# Patient Record
Sex: Female | Born: 1980 | Race: Black or African American | Hispanic: No | Marital: Single | Smoking: Former smoker
Health system: Southern US, Community
[De-identification: ages and names within clinical notes are randomized; demographics above are authoritative.]

## PROBLEM LIST (undated history)

## (undated) DIAGNOSIS — I10 Essential (primary) hypertension: Secondary | ICD-10-CM

## (undated) DIAGNOSIS — G43909 Migraine, unspecified, not intractable, without status migrainosus: Secondary | ICD-10-CM

## (undated) HISTORY — PX: TUBAL LIGATION: SHX77

## (undated) HISTORY — PX: ABDOMINAL HYSTERECTOMY: SHX81

---

## 2003-08-29 ENCOUNTER — Emergency Department (HOSPITAL_COMMUNITY): Admission: EM | Admit: 2003-08-29 | Discharge: 2003-08-29 | Payer: Self-pay | Admitting: Emergency Medicine

## 2004-07-18 IMAGING — US US OB COMP +14 WK
1 series · 13 of 28 positions shown · non-contrast
Comparison: none

CLINICAL DATA: 13 week 3 day gestational age by LMP; vaginal spotting; pelvic pain

[Series 1: unknown · 0.24mm/px · 13 of 33 slices shown]
[im 2/33]
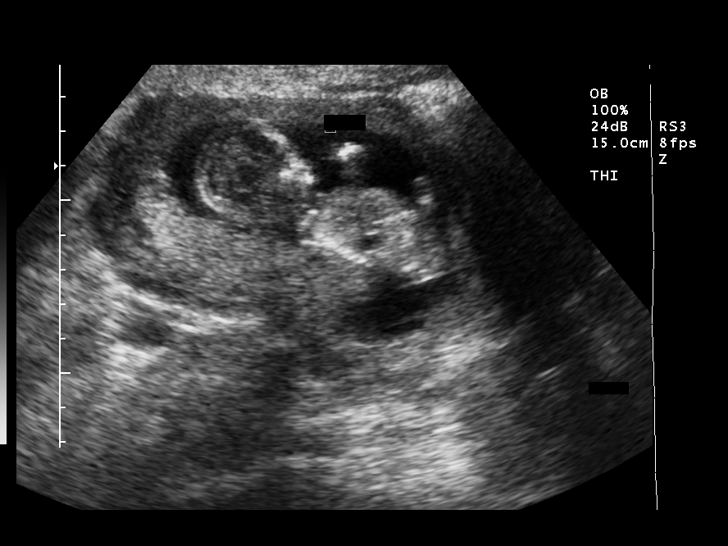
[im 4/33]
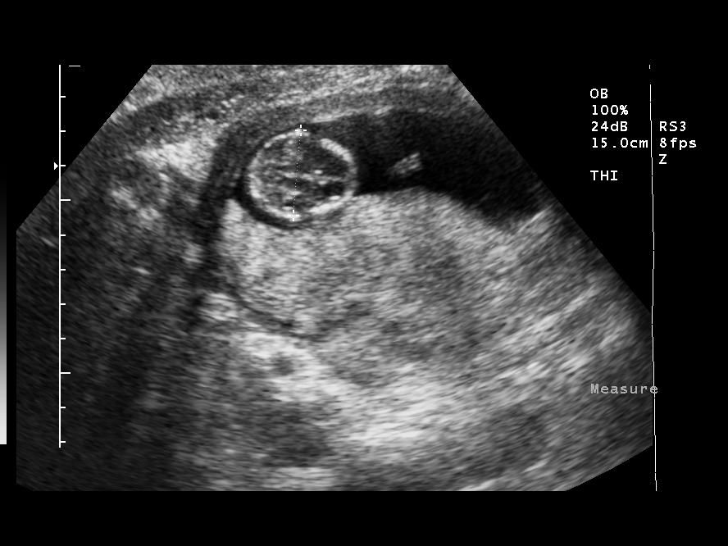
[im 6/33]
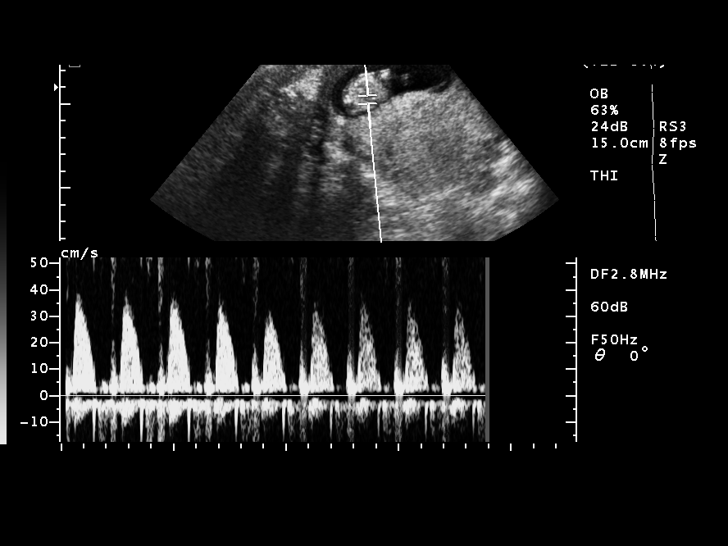
[im 9/33]
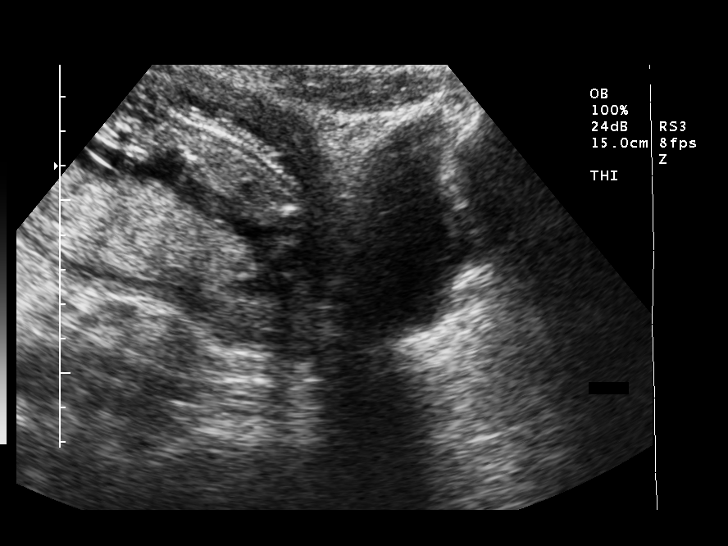
[im 11/33]
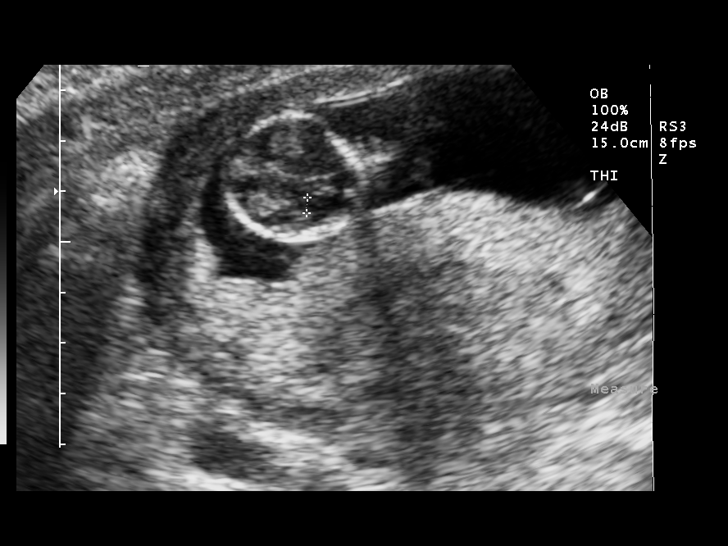
[im 14/33]
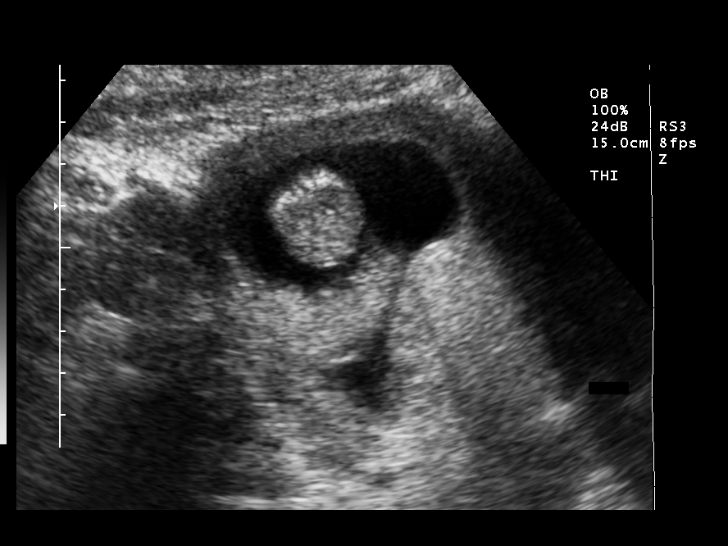
[im 17/33]
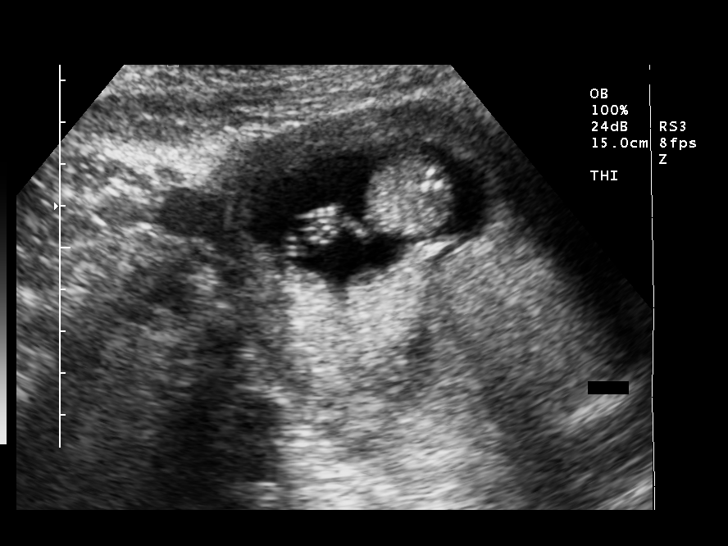
[im 19/33]
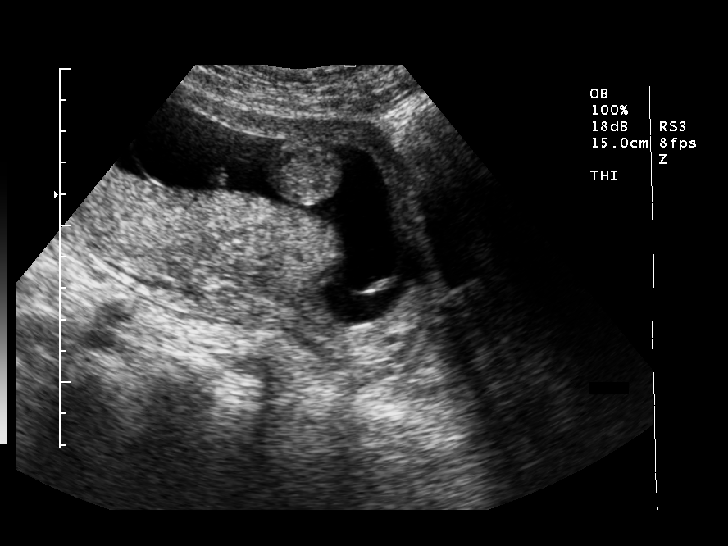
[im 22/33]
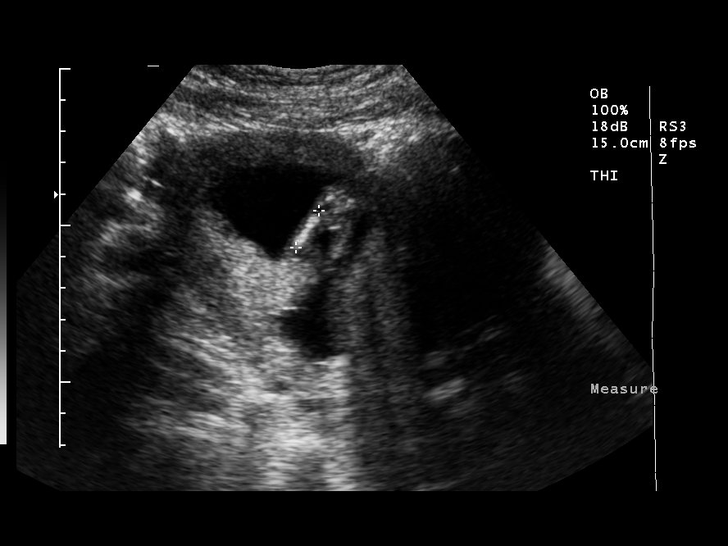
[im 24/33]
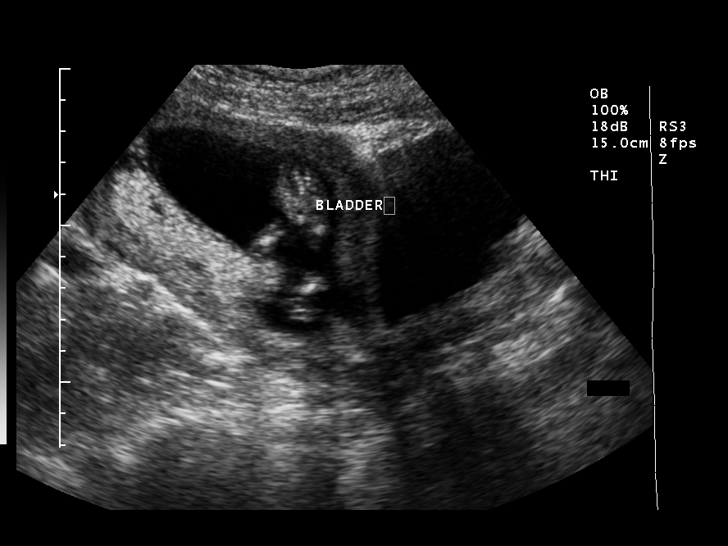
[im 27/33]
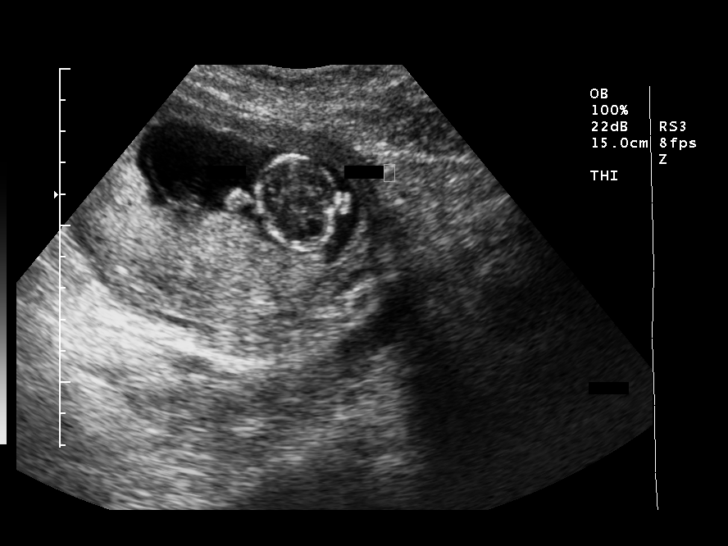
[im 29/33]
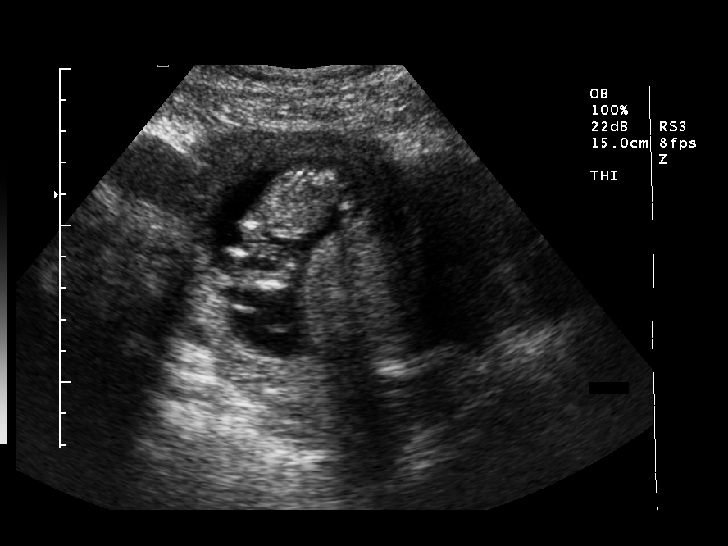
[im 31/33]
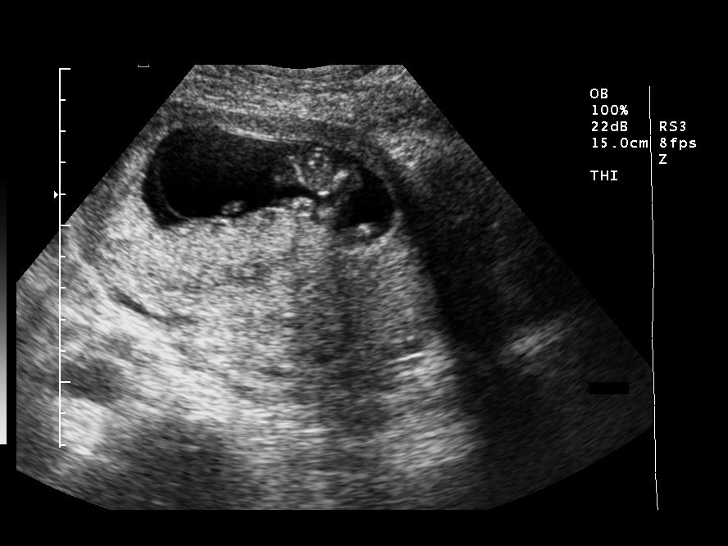

[13 of 28 positions shown; findings below may reference images not displayed]

OBSTETRICAL ULTRASOUND
Number of Fetuses:  Single
Heart Rate:  145 beats per minute 
Movement:  Yes
Breathing:  No  
Presentation:  Breech
Placental Location:  Posterior 
Previa:  No
Amniotic Fluid (Subjective):  Normal

FETAL BIOMETRY
BPD:  2.5 cm, 14 weeks 2 days
HC:  9.4 cm, 14 weeks 2 days
AC:  7.9 cm, 14 weeks 2 days
FL:  1.4 cm, 14 weeks 0 days

MEAN GA:  14 weeks 1 day

FETAL ANATOMY
Lateral Ventricles:  Visualized 
Thalami/CSP:    Visualized 
Posterior Fossa:  Not visualized 
Nuchal Region:  Not visualized 
Spine:    Not visualized 
4 Chamber Heart on Left:    Not visualized 
Stomach on Left:    Visualized 
3 Vessel Cord:  Not visualized 
Cord Insertion site:  Visualized 
Kidneys:  Not visualized 
Bladder:  Visualized 
Extremities:    Not visualized 

Evaluation limited by:  Early gestational age 

MATERNAL FINDINGS
Cervix: Not evaluate
IMPRESSION: 1.  Single living intrauterine fetus with mean gestational age of 14 weeks 1 day and sonographic EDC of 02/26/04.  This is within one week of LMP. 
2.  Complete fetal anatomic evaluation could not be performed due to early gestational age; however, no gross fetal abnormalities are identified.  Fetal anatomy could be evaluated optimally between 18 and 20 weeks.

## 2004-10-18 ENCOUNTER — Emergency Department (HOSPITAL_COMMUNITY): Admission: EM | Admit: 2004-10-18 | Discharge: 2004-10-18 | Payer: Self-pay | Admitting: Emergency Medicine

## 2004-11-05 ENCOUNTER — Emergency Department (HOSPITAL_COMMUNITY): Admission: EM | Admit: 2004-11-05 | Discharge: 2004-11-05 | Payer: Self-pay | Admitting: Emergency Medicine

## 2004-12-15 ENCOUNTER — Emergency Department (HOSPITAL_COMMUNITY): Admission: EM | Admit: 2004-12-15 | Discharge: 2004-12-15 | Payer: Self-pay | Admitting: Emergency Medicine

## 2005-07-28 ENCOUNTER — Emergency Department (HOSPITAL_COMMUNITY): Admission: EM | Admit: 2005-07-28 | Discharge: 2005-07-28 | Payer: Self-pay | Admitting: Emergency Medicine

## 2005-09-25 IMAGING — US US EXTREM LOW VENOUS*R*
1 series · 14 of 24 positions shown · non-contrast
Comparison: none

CLINICAL DATA: Right calf pain

 Right lower extremity venous Doppler ultrasound:
The lower extremity deep venous system demonstrates normal compressibility,
phasicity, and augmentation.   No evidence of DVT.

[Series 1: unknown · 14 of 27 slices shown]
[im 1/27]
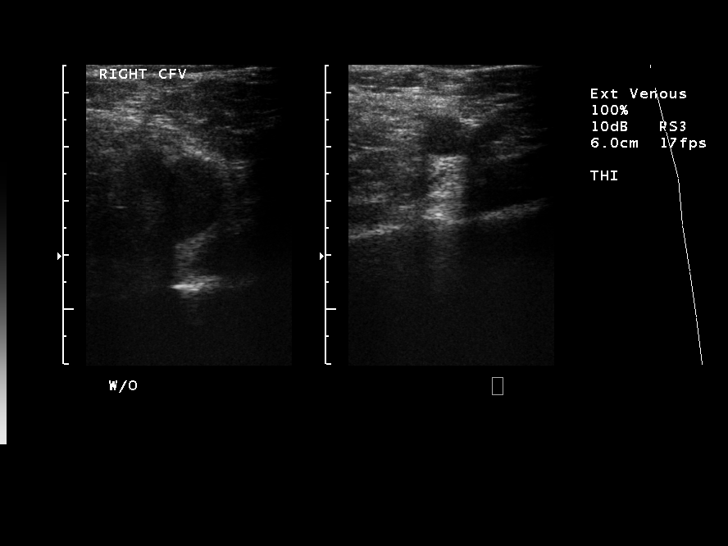
[im 3/27]
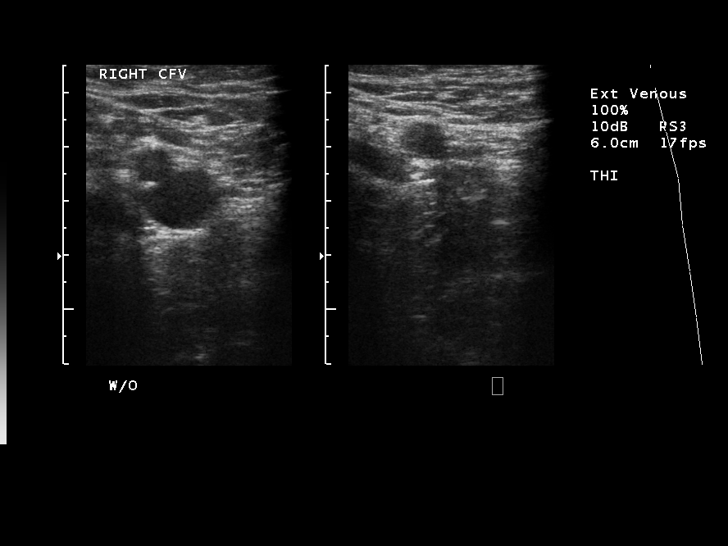
[im 5/27]
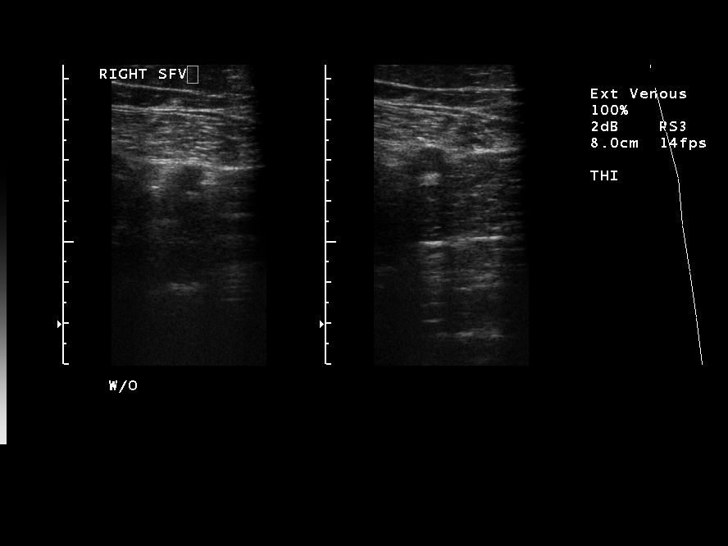
[im 7/27]
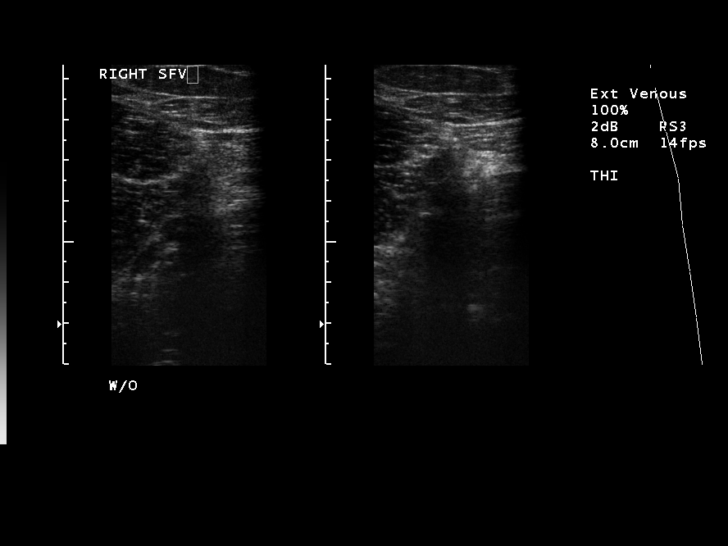
[im 8/27]
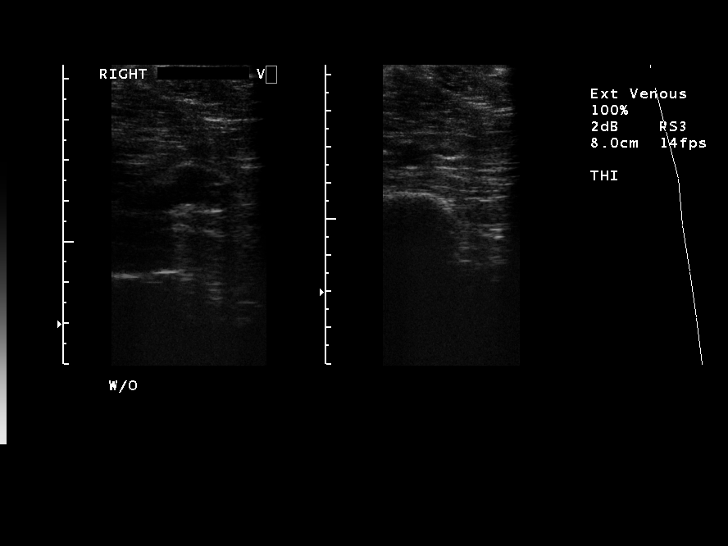
[im 11/27]
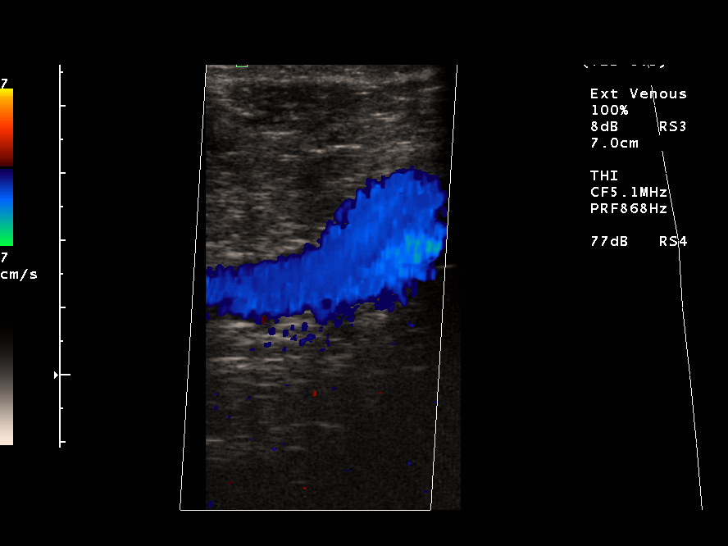
[im 13/27]
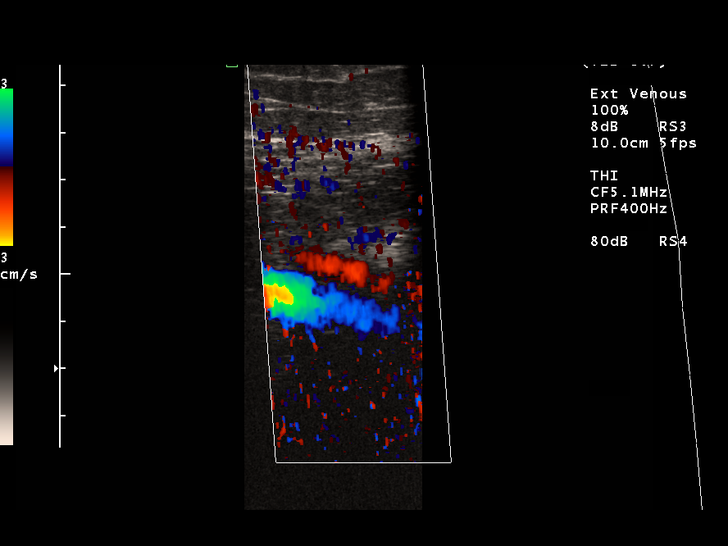
[im 14/27]
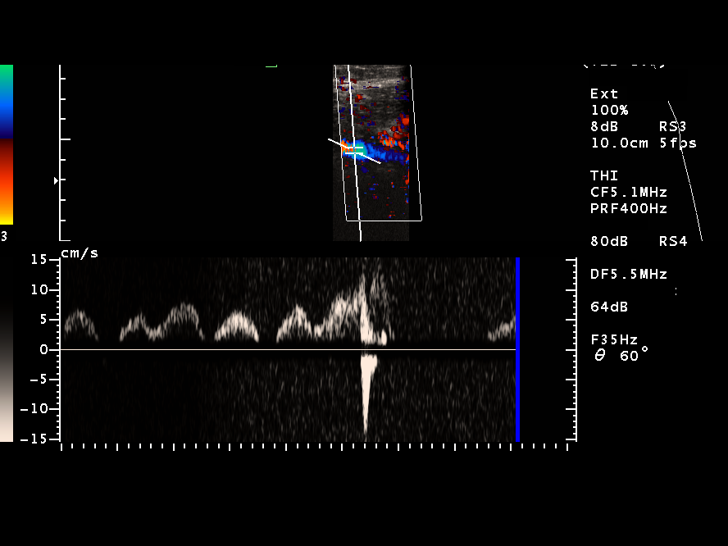
[im 16/27]
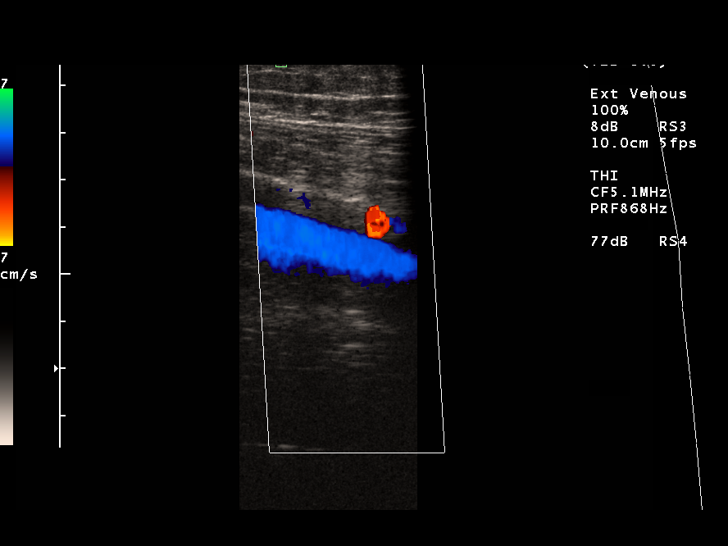
[im 19/27]
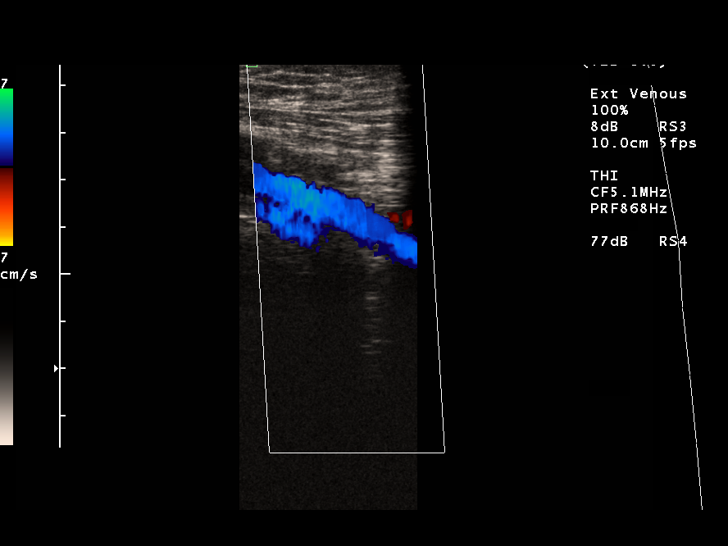
[im 21/27]
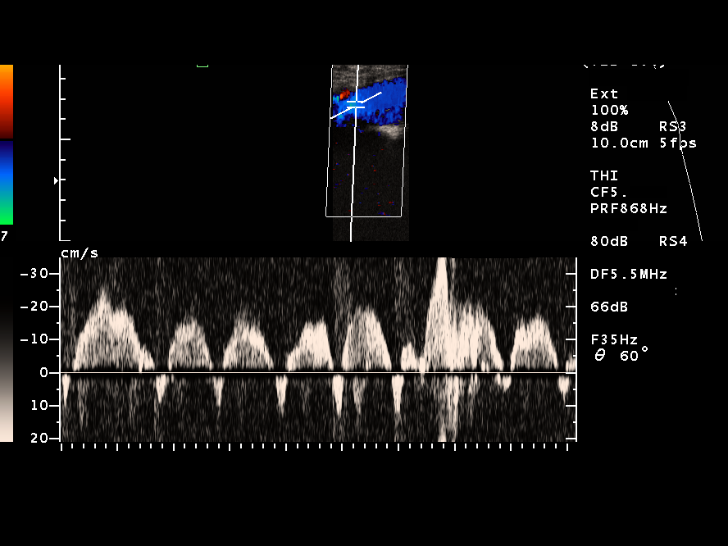
[im 22/27]
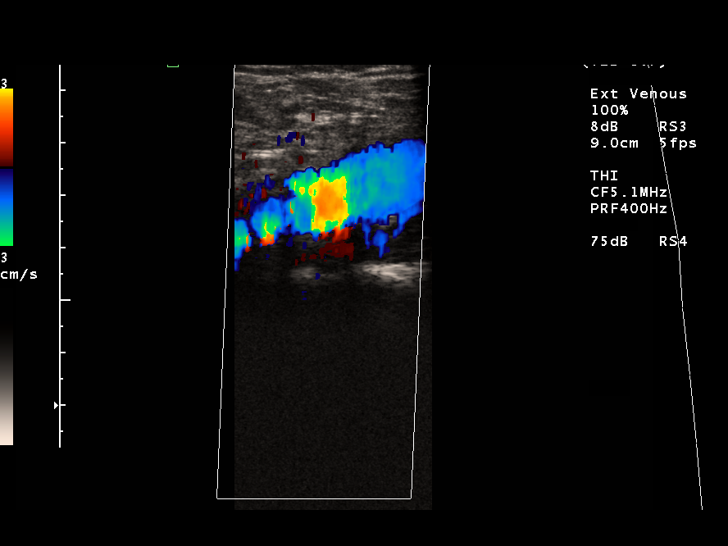
[im 24/27]
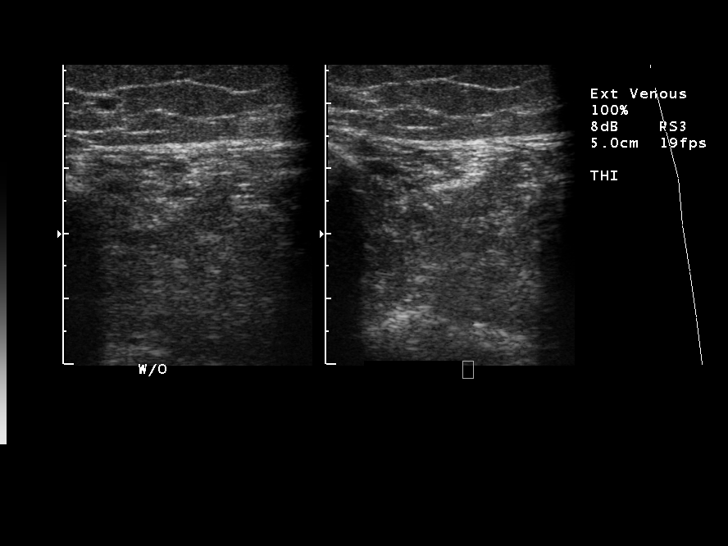
[im 27/27]
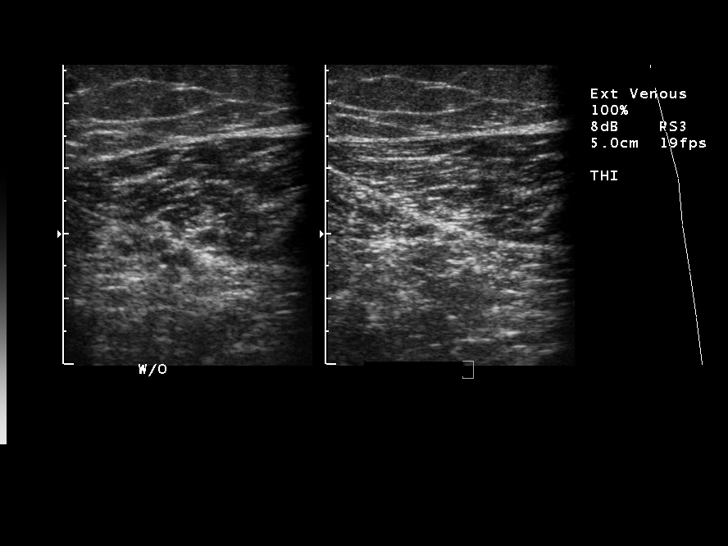

[14 of 24 positions shown; findings below may reference images not displayed]

IMPRESSION: 1. Negative for right lower extremity DVT

## 2008-04-04 ENCOUNTER — Emergency Department (HOSPITAL_COMMUNITY): Admission: EM | Admit: 2008-04-04 | Discharge: 2008-04-04 | Payer: Self-pay | Admitting: Emergency Medicine

## 2008-04-06 ENCOUNTER — Emergency Department (HOSPITAL_COMMUNITY): Admission: EM | Admit: 2008-04-06 | Discharge: 2008-04-07 | Payer: Self-pay | Admitting: Emergency Medicine

## 2008-11-27 ENCOUNTER — Emergency Department (HOSPITAL_COMMUNITY): Admission: EM | Admit: 2008-11-27 | Discharge: 2008-11-28 | Payer: Self-pay | Admitting: Emergency Medicine

## 2011-03-25 LAB — STREP A DNA PROBE: Group A Strep Probe: NEGATIVE

## 2012-06-01 ENCOUNTER — Emergency Department (HOSPITAL_COMMUNITY)
Admission: EM | Admit: 2012-06-01 | Discharge: 2012-06-02 | Disposition: A | Discharge status: Home / Self Care | Payer: Medicaid Other | Attending: Emergency Medicine | Admitting: Emergency Medicine

## 2012-06-01 ENCOUNTER — Encounter (HOSPITAL_COMMUNITY): Payer: Self-pay | Admitting: *Deleted

## 2012-06-01 DIAGNOSIS — Z3202 Encounter for pregnancy test, result negative: Secondary | ICD-10-CM | POA: Insufficient documentation

## 2012-06-01 DIAGNOSIS — K2990 Gastroduodenitis, unspecified, without bleeding: Secondary | ICD-10-CM | POA: Insufficient documentation

## 2012-06-01 DIAGNOSIS — R112 Nausea with vomiting, unspecified: Secondary | ICD-10-CM | POA: Insufficient documentation

## 2012-06-01 DIAGNOSIS — K2970 Gastritis, unspecified, without bleeding: Secondary | ICD-10-CM | POA: Insufficient documentation

## 2012-06-01 DIAGNOSIS — N898 Other specified noninflammatory disorders of vagina: Secondary | ICD-10-CM | POA: Insufficient documentation

## 2012-06-01 DIAGNOSIS — F17200 Nicotine dependence, unspecified, uncomplicated: Secondary | ICD-10-CM | POA: Insufficient documentation

## 2012-06-01 LAB — CBC WITH DIFFERENTIAL/PLATELET
Basophils Relative: 2 % — ABNORMAL HIGH (ref 0–1)
Eosinophils Absolute: 0.5 10*3/uL (ref 0.0–0.7)
Lymphocytes Relative: 36 % (ref 12–46)
MCHC: 31.5 g/dL (ref 30.0–36.0)
MCV: 82.3 fL (ref 78.0–100.0)
Neutrophils Relative %: 49 % (ref 43–77)
RBC: 4.01 MIL/uL (ref 3.87–5.11)
RDW: 17.5 % — ABNORMAL HIGH (ref 11.5–15.5)

## 2012-06-01 LAB — COMPREHENSIVE METABOLIC PANEL
AST: 26 U/L (ref 0–37)
BUN: 9 mg/dL (ref 6–23)
CO2: 26 mEq/L (ref 19–32)
Calcium: 9.7 mg/dL (ref 8.4–10.5)
Chloride: 102 mEq/L (ref 96–112)
Creatinine, Ser: 0.82 mg/dL (ref 0.50–1.10)
Potassium: 3.9 mEq/L (ref 3.5–5.1)

## 2012-06-01 LAB — LIPASE, BLOOD: Lipase: 36 U/L (ref 11–59)

## 2012-06-01 MED ORDER — ONDANSETRON HCL 4 MG/2ML IJ SOLN
4.0000 mg | Freq: Once | INTRAMUSCULAR | Status: AC
  Administered 2012-06-01: 4 mg via INTRAVENOUS
  Filled 2012-06-01: qty 2

## 2012-06-01 MED ORDER — PANTOPRAZOLE SODIUM 40 MG IV SOLR
40.0000 mg | Freq: Once | INTRAVENOUS | Status: AC
  Administered 2012-06-01: 40 mg via INTRAVENOUS
  Filled 2012-06-01: qty 40

## 2012-06-01 MED ORDER — HYDROMORPHONE HCL PF 1 MG/ML IJ SOLN
0.5000 mg | Freq: Once | INTRAMUSCULAR | Status: AC
  Administered 2012-06-01: 0.5 mg via INTRAVENOUS
  Filled 2012-06-01: qty 1

## 2012-06-01 MED ORDER — GI COCKTAIL ~~LOC~~
30.0000 mL | Freq: Once | ORAL | Status: AC
  Administered 2012-06-01: 30 mL via ORAL
  Filled 2012-06-01: qty 30

## 2012-06-01 MED ORDER — SODIUM CHLORIDE 0.9 % IV BOLUS (SEPSIS)
1000.0000 mL | Freq: Once | INTRAVENOUS | Status: AC
  Administered 2012-06-01: 1000 mL via INTRAVENOUS

## 2012-06-01 NOTE — ED Notes (Signed)
Advised patient that we needed urine specimen.  She cannot provide at this time.  Advised her as soon as she could to be let nurse or tech know.

## 2012-06-01 NOTE — ED Provider Notes (Signed)
History  This chart was scribed for Dione Booze, MD by Bennett Scrape, ED Scribe. This patient was seen in room [facility] and the patient's care was started at 11:00 PM.  CSN: [id number]  Arrival date & time 06/01/12  1640   First MD Initiated Contact with Patient 03/23/12 2300    Chief Complaint  Patient presents with  . Abdominal Pain     The history is provided by the patient. No language interpreter was used.   Jillian Snyder is a 31 y.o. female who presents to the Emergency Department complaining of 2 days of sudden onset, non-changing, intermittent, non-radiating epigastric abdominal pain with associated nausea and non-bloody emesis. She states that her episodes last 30 seconds at a time and come "back to back". She rates her pain a 10 out of 10 currently and reports that the pain is aggravated by eating. She states that the pain is also aggravated by taking tylenol. She reports that she started her LNMP 2 days ago and states that it is heavier than normal. She states that she was scheduled to get a hysterectomy, but the surgeon stopped taking Medicaid and the surgery was cancelled. She denies fever, chills, urinary symptoms and diarrhea as associated symptoms. She reports that she has a tubal ligation surgery and denies using other contraceptives. She does not have a h/o chronic medical conditions. She is a current everyday smoker (0.5 packs per day) but denies alcohol use.  No current PCP, used to see [facility]  History reviewed. No pertinent past medical history.  Past Surgical History  Procedure Date  . Tubal ligation     History reviewed. No pertinent family history.  History  Substance Use Topics  . Smoking status: Current Every Day Smoker  . Smokeless tobacco: Not on file  . Alcohol Use: No    No OB history provided.  Review of Systems  Constitutional: Negative for fever and chills.  Respiratory: Negative for cough and shortness of breath.   Gastrointestinal:  Positive for nausea, vomiting and abdominal pain. Negative for diarrhea.  Genitourinary: Positive for vaginal bleeding (NMP). Negative for dysuria and frequency.  All other systems reviewed and are negative.    Allergies  Penicillins  Home Medications   Current Outpatient Rx  Name  Route  Sig  Dispense  Refill  . ACETAMINOPHEN 500 MG PO TABS   Oral   Take 500 mg by mouth every 6 (six) hours as needed.         . IBUPROFEN 200 MG PO TABS   Oral   Take 400 mg by mouth every 6 (six) hours as needed. FOR PAIN           Triage Vitals: BP 129/94  Pulse 71  Temp 98.5 F (36.9 C) (Oral)  Resp 20  Ht 5\' 3"  (1.6 m)  Wt 155 lb (70.308 kg)  BMI 27.46 kg/m2  SpO2 98%  LMP 05/30/2012  Physical Exam  Nursing note and vitals reviewed. Constitutional: She is oriented to person, place, and time. She appears well-developed and well-nourished. No distress.  HENT:  Head: Normocephalic and atraumatic.  Eyes: Conjunctivae normal and EOM are normal.  Neck: Neck supple. No tracheal deviation present.  Cardiovascular: Normal rate and regular rhythm.   No murmur heard. Pulmonary/Chest: Effort normal and breath sounds normal. No respiratory distress.  Abdominal: Soft. Bowel sounds are normal. There is tenderness. There is no guarding.       Mild RLQ tenderness, moderate epigastric tenderness  Musculoskeletal: Normal  range of motion.  Neurological: She is alert and oriented to person, place, and time.  Skin: Skin is warm and dry.  Psychiatric: She has a normal mood and affect. Her behavior is normal.    ED Course  Procedures (including critical care time)  DIAGNOSTIC STUDIES: Oxygen Saturation is 98% on room air, normal by my interpretation.    COORDINATION OF CARE: 10:45 PM- Attending provider Adriana Simas ordered 4 mg Zofran, 0.5 dilaudid injection and 1,000 mL of bolus  11:07 PM- Discussed treatment plan which includes UA and blood work with pt at bedside and pt agreed to plan.  11:15  PM- Ordered 40 mg of protonix and a GI cocktail    Results for orders placed during the hospital encounter of 06/01/12  CBC WITH DIFFERENTIAL      Component Value Range   WBC 9.1  4.0 - 10.5 K/uL   RBC 4.01  3.87 - 5.11 MIL/uL   Hemoglobin 10.4 (*) 12.0 - 15.0 g/dL   HCT 16.1 (*) 09.6 - 04.5 %   MCV 82.3  78.0 - 100.0 fL   MCH 25.9 (*) 26.0 - 34.0 pg   MCHC 31.5  30.0 - 36.0 g/dL   RDW 40.9 (*) 81.1 - 91.4 %   Platelets 370  150 - 400 K/uL   Neutrophils Relative 49  43 - 77 %   Neutro Abs 4.5  1.7 - 7.7 K/uL   Lymphocytes Relative 36  12 - 46 %   Lymphs Abs 3.3  0.7 - 4.0 K/uL   Monocytes Relative 7  3 - 12 %   Monocytes Absolute 0.7  0.1 - 1.0 K/uL   Eosinophils Relative 6 (*) 0 - 5 %   Eosinophils Absolute 0.5  0.0 - 0.7 K/uL   Basophils Relative 2 (*) 0 - 1 %   Basophils Absolute 0.2 (*) 0.0 - 0.1 K/uL  COMPREHENSIVE METABOLIC PANEL      Component Value Range   Sodium 137  135 - 145 mEq/L   Potassium 3.9  3.5 - 5.1 mEq/L   Chloride 102  96 - 112 mEq/L   CO2 26  19 - 32 mEq/L   Glucose, Bld 112 (*) 70 - 99 mg/dL   BUN 9  6 - 23 mg/dL   Creatinine, Ser 7.82  0.50 - 1.10 mg/dL   Calcium 9.7  8.4 - 95.6 mg/dL   Total Protein 7.6  6.0 - 8.3 g/dL   Albumin 3.9  3.5 - 5.2 g/dL   AST 26  0 - 37 U/L   ALT 19  0 - 35 U/L   Alkaline Phosphatase 104  39 - 117 U/L   Total Bilirubin 0.1 (*) 0.3 - 1.2 mg/dL   GFR calc non Af Amer >90  >90 mL/min   GFR calc Af Amer >90  >90 mL/min  LIPASE, BLOOD      Component Value Range   Lipase 36  11 - 59 U/L  URINALYSIS, ROUTINE W REFLEX MICROSCOPIC      Component Value Range   Color, Urine YELLOW  YELLOW   APPearance CLEAR  CLEAR   Specific Gravity, Urine >1.030 (*) 1.005 - 1.030   pH 6.0  5.0 - 8.0   Glucose, UA NEGATIVE  NEGATIVE mg/dL   Hgb urine dipstick NEGATIVE  NEGATIVE   Bilirubin Urine NEGATIVE  NEGATIVE   Ketones, ur NEGATIVE  NEGATIVE mg/dL   Protein, ur NEGATIVE  NEGATIVE mg/dL   Urobilinogen, UA 0.2  0.0 - 1.0  mg/dL    Nitrite NEGATIVE  NEGATIVE   Leukocytes, UA NEGATIVE  NEGATIVE  PREGNANCY, URINE      Component Value Range   Preg Test, Ur NEGATIVE  NEGATIVE     1. Gastritis   2. Nausea & vomiting       MDM  Epigastric pain, nausea, vomiting-most likely due to gastritis. She'll be given a trial of a GI cocktail and a proton pump inhibitor. IV fluids, ondansetron, and hydromorphone will be given as well.  She feels much better after the above noted treatment. Laboratory workup was significant for evidence of dehydration with high urine specific gravity. She will be sent home with prescription for pantoprazole and metoclopramide.     I personally performed the services described in this documentation, which was scribed in my presence. The recorded information has been reviewed and is accurate.      Dione Booze, MD 06/02/12 [phone]

## 2012-06-01 NOTE — ED Notes (Signed)
Upper abd pain for 2 days, Nausea, vomiting, LMP now

## 2012-06-02 LAB — URINALYSIS, ROUTINE W REFLEX MICROSCOPIC
Bilirubin Urine: NEGATIVE
Glucose, UA: NEGATIVE mg/dL
Ketones, ur: NEGATIVE mg/dL
Protein, ur: NEGATIVE mg/dL
Urobilinogen, UA: 0.2 mg/dL (ref 0.0–1.0)
pH: 6 (ref 5.0–8.0)

## 2012-06-02 LAB — PREGNANCY, URINE: Preg Test, Ur: NEGATIVE

## 2012-06-02 MED ORDER — PANTOPRAZOLE SODIUM 20 MG PO TBEC: 40.0000 mg | DELAYED_RELEASE_TABLET | Freq: Every day | ORAL | Status: AC

## 2012-06-02 MED ORDER — METOCLOPRAMIDE HCL 10 MG PO TABS: 10.0000 mg | ORAL_TABLET | Freq: Four times a day (QID) | ORAL | Status: AC | PRN

## 2016-05-08 ENCOUNTER — Emergency Department (HOSPITAL_COMMUNITY)
Admission: EM | Admit: 2016-05-08 | Discharge: 2016-05-08 | Disposition: A | Discharge status: Home / Self Care | Payer: Medicaid Other | Attending: Emergency Medicine | Admitting: Emergency Medicine

## 2016-05-08 ENCOUNTER — Encounter (HOSPITAL_COMMUNITY): Payer: Self-pay

## 2016-05-08 DIAGNOSIS — F17200 Nicotine dependence, unspecified, uncomplicated: Secondary | ICD-10-CM | POA: Diagnosis not present

## 2016-05-08 DIAGNOSIS — R51 Headache: Secondary | ICD-10-CM | POA: Diagnosis present

## 2016-05-08 DIAGNOSIS — Z79899 Other long term (current) drug therapy: Secondary | ICD-10-CM | POA: Insufficient documentation

## 2016-05-08 DIAGNOSIS — Z791 Long term (current) use of non-steroidal anti-inflammatories (NSAID): Secondary | ICD-10-CM | POA: Diagnosis not present

## 2016-05-08 DIAGNOSIS — I10 Essential (primary) hypertension: Secondary | ICD-10-CM | POA: Insufficient documentation

## 2016-05-08 DIAGNOSIS — R519 Headache, unspecified: Secondary | ICD-10-CM

## 2016-05-08 HISTORY — DX: Essential (primary) hypertension: I10

## 2016-05-08 HISTORY — DX: Migraine, unspecified, not intractable, without status migrainosus: G43.909

## 2016-05-08 MED ORDER — LISINOPRIL-HYDROCHLOROTHIAZIDE 10-12.5 MG PO TABS: 1.0000 | ORAL_TABLET | Freq: Every day | ORAL | 0 refills | Status: AC

## 2016-05-08 MED ORDER — METOCLOPRAMIDE HCL 5 MG/ML IJ SOLN
10.0000 mg | Freq: Once | INTRAMUSCULAR | Status: AC
  Administered 2016-05-08: 10 mg via INTRAMUSCULAR
  Filled 2016-05-08: qty 2

## 2016-05-08 MED ORDER — DIPHENHYDRAMINE HCL 25 MG PO CAPS
25.0000 mg | ORAL_CAPSULE | Freq: Once | ORAL | Status: AC
  Administered 2016-05-08: 25 mg via ORAL
  Filled 2016-05-08: qty 1

## 2016-05-08 MED ORDER — METOCLOPRAMIDE HCL 5 MG/ML IJ SOLN
INTRAMUSCULAR | Status: AC
  Filled 2016-05-08: qty 2

## 2016-05-08 NOTE — ED Notes (Signed)
Pt states "I do not want IV" RN notified MD

## 2016-05-08 NOTE — Discharge Instructions (Signed)
Please read and follow all provided instructions.  Your diagnoses today include:  1. Hypertension, unspecified type   2. Nonintractable headache, unspecified chronicity pattern, unspecified headache type     Tests performed today include: Vital signs. See below for your results today.   Medications:  In the Emergency Department you received: Reglan - antinausea/headache medication Benadryl - antihistamine to counteract potential side effects of reglan  Take any prescribed medications only as directed.  Additional information:  Follow any educational materials contained in this packet.  You are having a headache. No specific cause was found today for your headache. It may have been a migraine or other cause of headache. Stress, anxiety, fatigue, and depression are common triggers for headaches.   Your headache today does not appear to be life-threatening or require hospitalization, but often the exact cause of headaches is not determined in the emergency department. Therefore, follow-up with your doctor is very important to find out what may have caused your headache and whether or not you need any further diagnostic testing or treatment.   Sometimes headaches can appear benign (not harmful), but then more serious symptoms can develop which should prompt an immediate re-evaluation by your doctor or the emergency department.  BE VERY CAREFUL not to take multiple medicines containing Tylenol (also called acetaminophen). Doing so can lead to an overdose which can damage your liver and cause liver failure and possibly death.   Follow-up instructions: Please follow-up with your primary care provider in the next 3 days for further evaluation of your symptoms.   Return instructions:  Please return to the Emergency Department if you experience worsening symptoms. Return if the medications do not resolve your headache, if it recurs, or if you have multiple episodes of vomiting or cannot keep  down fluids. Return if you have a change from the usual headache. RETURN IMMEDIATELY IF you: Develop a sudden, severe headache Develop confusion or become poorly responsive or faint Develop a fever above 100.27F or problem breathing Have a change in speech, vision, swallowing, or understanding Develop new weakness, numbness, tingling, incoordination in your arms or legs Have a seizure Please return if you have any other emergent concerns.  Additional Information:  Your vital signs today were: BP (!) 169/111 (BP Location: Left Arm)    Pulse 61    Temp 98.2 F (36.8 C) (Oral)    Resp 18    LMP 05/30/2012    SpO2 100%  If your blood pressure (BP) was elevated above 135/85 this visit, please have this repeated by your doctor within one month. --------------

## 2016-05-08 NOTE — ED Provider Notes (Signed)
[facility]-EMERGENCY DEPT Provider Note   CSN: [id number] Arrival date & time: 05/08/16  [id number]  History   Chief Complaint Chief Complaint  Patient presents with  . Headache    HPI Jillian Snyder is a 35 y.o. female.  HPI  35 y.o. female with a hx of HTN, Migraines, presents to the Emergency Department today complaining of headache x 3 days.  Notes associated nausea. No emesis. No fevers. States headache was gradual in onset and isolated to left side. Notes pain 10/10 and aching sensation. Ibuprofen with minimal relief. No vision changes. No numbness/tingling. Of note, pt ran out of BP meds last Friday. Unsure of which kind. States she has appointment on Friday for refill. No other symptoms noted.    Past Medical History:  Diagnosis Date  . Hypertension   . Migraine     There are no active problems to display for this patient.   Past Surgical History:  Procedure Laterality Date  . ABDOMINAL HYSTERECTOMY    . TUBAL LIGATION      OB History    No data available       Home Medications    Prior to Admission medications   Medication Sig Start Date End Date Taking? Authorizing Provider  acetaminophen (TYLENOL) 500 MG tablet Take 500 mg by mouth every 6 (six) hours as needed.    Historical Provider, MD  ibuprofen (ADVIL,MOTRIN) 200 MG tablet Take 400 mg by mouth every 6 (six) hours as needed. FOR PAIN    Historical Provider, MD  metoCLOPramide (REGLAN) 10 MG tablet Take 1 tablet (10 mg total) by mouth every 6 (six) hours as needed. 06/02/12   Dione Boozeavid Glick, MD  pantoprazole (PROTONIX) 20 MG tablet Take 2 tablets (40 mg total) by mouth daily. 06/02/12   Dione Boozeavid Glick, MD    Family History No family history on file.  Social History Social History  Substance Use Topics  . Smoking status: Current Every Day Smoker  . Smokeless tobacco: Never Used  . Alcohol use No     Allergies   Penicillins   Review of Systems Review of Systems ROS reviewed and all are negative for acute  change except as noted in the HPI  Physical Exam Updated Vital Signs BP (!) 169/111 (BP Location: Left Arm)   Pulse 61   Temp 98.2 F (36.8 C) (Oral)   Resp 18   LMP 05/30/2012   SpO2 100%   Physical Exam  Constitutional: She is oriented to person, place, and time. Vital signs are normal. She appears well-developed and well-nourished.  HENT:  Head: Normocephalic.  Right Ear: Hearing normal.  Left Ear: Hearing normal.  Eyes: Conjunctivae and EOM are normal. Pupils are equal, round, and reactive to light.  Neck: Normal range of motion. Neck supple.  Cardiovascular: Normal rate, regular rhythm, normal heart sounds and intact distal pulses.   Pulmonary/Chest: Effort normal and breath sounds normal.  Neurological: She is alert and oriented to person, place, and time. She has normal strength. No cranial nerve deficit or sensory deficit.  Cranial Nerves:  II: Pupils equal, round, reactive to light III,IV, VI: ptosis not present, extra-ocular motions intact bilaterally  V,VII: smile symmetric, facial light touch sensation equal VIII: hearing grossly normal bilaterally  IX,X: midline uvula rise  XI: bilateral shoulder shrug equal and strong XII: midline tongue extension  Skin: Skin is warm and dry.  Psychiatric: She has a normal mood and affect. Her speech is normal and behavior is normal. Thought content normal.  Nursing note and vitals reviewed.  ED Treatments / Results  Labs (all labs ordered are listed, but only abnormal results are displayed) Labs Reviewed - No data to display  EKG  EKG Interpretation None      Radiology No results found.  Procedures Procedures (including critical care time)  Medications Ordered in ED Medications - No data to display   Initial Impression / Assessment and Plan / ED Course  I have reviewed the triage vital signs and the nursing notes.  Pertinent labs & imaging results that were available during my care of the patient were  reviewed by me and considered in my medical decision making (see chart for details).  Clinical Course    Final Clinical Impressions(s) / ED Diagnoses  I have reviewed the relevant previous healthcare records. I obtained HPI from historian.  ED Course:  Assessment: Patient is a 35yF that presents with headache x 3 days. Notes running out of BP meds last Friday. Appointment to see PCP this coming Friday for Meds. Unsure of medication. Notes nausea. No emesis. No vision changes. No numbness/tingling. Patient is without high-risk features of headache including: Sudden onset/thunderclap HA, No similar headache in past, Altered mental status, Accompanying seizure, Headache with exertion, Age > 50, History of immunocompromise, Neck or shoulder pain, Fever, Use of anticoagulation, Family history of spontaneous SAH, Concomitant drug use, Toxic exposure.  Patient has a normal complete neurological exam, normal vital signs, normal level of consciousness, no signs of meningismus, is well-appearing/non-toxic appearing, no signs of trauma. No papilledema, no pain over the temporal arteries. Imaging with CT/MRI not indicated given history and physical exam findings. No dangerous or life-threatening conditions suspected or identified by history, physical exam, and by work-up. No indications for hospitalization identified. Given migraine cocktail in ED. Rx of Lisinopril-HCTZ given. Follow up to PCP   Disposition/Plan:  DC Home Additional Verbal discharge instructions given and discussed with patient.  Pt Instructed to f/u with PCP in the next week for evaluation and treatment of symptoms. Return precautions given Pt acknowledges and agrees with plan  Supervising Physician Jillian JesterKathleen McManus, DO   Final diagnoses:  Hypertension, unspecified type  Nonintractable headache, unspecified chronicity pattern, unspecified headache type    New Prescriptions New Prescriptions   No medications on file     Jillian Piliyler  Ali Mohl, PA-C 05/08/16 1014    Jillian JesterKathleen McManus, DO 05/08/16 1622

## 2016-05-08 NOTE — ED Triage Notes (Signed)
Pt reports headache x 3 days with nausea.  Reports has been out of her bp medication since last Friday and plans to get it refilled this Friday.

## 2017-06-08 ENCOUNTER — Other Ambulatory Visit: Payer: Self-pay

## 2017-06-08 ENCOUNTER — Emergency Department (HOSPITAL_COMMUNITY)
Admission: EM | Admit: 2017-06-08 | Discharge: 2017-06-08 | Disposition: A | Discharge status: Home / Self Care | Payer: Medicaid Other | Attending: Emergency Medicine | Admitting: Emergency Medicine

## 2017-06-08 ENCOUNTER — Encounter (HOSPITAL_COMMUNITY): Payer: Self-pay | Admitting: Emergency Medicine

## 2017-06-08 DIAGNOSIS — K0510 Chronic gingivitis, plaque induced: Secondary | ICD-10-CM

## 2017-06-08 DIAGNOSIS — I10 Essential (primary) hypertension: Secondary | ICD-10-CM | POA: Insufficient documentation

## 2017-06-08 DIAGNOSIS — K0500 Acute gingivitis, plaque induced: Secondary | ICD-10-CM | POA: Insufficient documentation

## 2017-06-08 DIAGNOSIS — Z87891 Personal history of nicotine dependence: Secondary | ICD-10-CM | POA: Diagnosis not present

## 2017-06-08 DIAGNOSIS — Z79899 Other long term (current) drug therapy: Secondary | ICD-10-CM | POA: Insufficient documentation

## 2017-06-08 DIAGNOSIS — K0889 Other specified disorders of teeth and supporting structures: Secondary | ICD-10-CM | POA: Diagnosis present

## 2017-06-08 MED ORDER — CLINDAMYCIN HCL 150 MG PO CAPS: 300.0000 mg | ORAL_CAPSULE | Freq: Four times a day (QID) | ORAL | 0 refills | Status: AC

## 2017-06-08 MED ORDER — IBUPROFEN 100 MG PO CHEW: 600.0000 mg | CHEWABLE_TABLET | Freq: Three times a day (TID) | ORAL | 0 refills | Status: AC | PRN

## 2017-06-08 MED ORDER — IBUPROFEN 100 MG/5ML PO SUSP
400.0000 mg | Freq: Once | ORAL | Status: AC
  Administered 2017-06-08: 400 mg via ORAL
  Filled 2017-06-08: qty 20

## 2017-06-08 MED ORDER — CLINDAMYCIN HCL 150 MG PO CAPS
300.0000 mg | ORAL_CAPSULE | Freq: Once | ORAL | Status: AC
  Administered 2017-06-08: 300 mg via ORAL
  Filled 2017-06-08: qty 2

## 2017-06-08 NOTE — Discharge Instructions (Signed)
Call one of the dentists listed to arrange a follow-up appt.

## 2017-06-08 NOTE — ED Triage Notes (Signed)
Patient c/o dental abscess x2 days. Denies any drainage or fevers. Per patient abscess on lower gum.

## 2017-06-08 NOTE — ED Provider Notes (Signed)
[facility] Provider Note   CSN: [id number] Arrival date & time: 06/08/17  1122     History   Chief Complaint Chief Complaint  Patient presents with  . Dental Pain    HPI Jillian Snyder is a 36 y.o. female.  HPI  Jillian Snyder is a 36 y.o. female who presents to the Emergency Department complaining of lower dental pain for 2 days.  She complains of pain and swelling to the gums surrounding her front lower teeth.  Pain is worse with chewing.  She reports intermittent bleeding of her gums.  She denies fever, facial pain or swelling, and neck pain.  She has not tried any therapies prior to arrival.    Past Medical History:  Diagnosis Date  . Hypertension   . Migraine     There are no active problems to display for this patient.   Past Surgical History:  Procedure Laterality Date  . ABDOMINAL HYSTERECTOMY    . TUBAL LIGATION      OB History    Gravida Para Term Preterm AB Living   2 2 2     2    SAB TAB Ectopic Multiple Live Births                   Home Medications    Prior to Admission medications   Medication Sig Start Date End Date Taking? Authorizing Provider  acetaminophen (TYLENOL) 500 MG tablet Take 500 mg by mouth every 6 (six) hours as needed.    [provider]  ibuprofen (ADVIL,MOTRIN) 200 MG tablet Take 400 mg by mouth every 6 (six) hours as needed. FOR PAIN    [provider]  lisinopril-hydrochlorothiazide (PRINZIDE,ZESTORETIC) 10-12.5 MG tablet Take 1 tablet by mouth daily. 05/08/16   Audry PiliMohr, Tyler, PA-C  LISINOPRIL-HYDROCHLOROTHIAZIDE PO Take 10-12.5 mg by mouth.    [provider]  metoCLOPramide (REGLAN) 10 MG tablet Take 1 tablet (10 mg total) by mouth every 6 (six) hours as needed. 06/02/12   Dione BoozeGlick, David, MD  pantoprazole (PROTONIX) 20 MG tablet Take 2 tablets (40 mg total) by mouth daily. 06/02/12   Dione BoozeGlick, David, MD    Family History No family history on file.  Social History Social  History   Tobacco Use  . Smoking status: Former Smoker    Packs/day: 0.04    Years: 15.00    Pack years: 0.60    Types: Cigarettes    Last attempt to quit: 04/09/2017    Years since quitting: 0.1  . Smokeless tobacco: Never Used  Substance Use Topics  . Alcohol use: No  . Drug use: No     Allergies   Penicillins   Review of Systems Review of Systems  Constitutional: Negative for appetite change and fever.  HENT: Positive for dental problem. Negative for congestion, facial swelling, sore throat and trouble swallowing.   Eyes: Negative for pain and visual disturbance.  Musculoskeletal: Negative for neck pain and neck stiffness.  Neurological: Negative for dizziness, facial asymmetry and headaches.  Hematological: Negative for adenopathy.  All other systems reviewed and are negative.    Physical Exam Updated Vital Signs BP (!) 128/95 (BP Location: Right Arm)   Pulse 78   Temp 99.1 F (37.3 C)   Resp 17   Ht 5\' 4"  (1.626 m)   Wt 90.7 kg (200 lb)   LMP 05/30/2012   SpO2 100%   BMI 34.33 kg/m   Physical Exam  Constitutional: She is oriented to  person, place, and time. She appears well-developed and well-nourished. No distress.  HENT:  Head: Normocephalic and atraumatic.  Right Ear: Tympanic membrane and ear canal normal.  Left Ear: Tympanic membrane and ear canal normal.  Mouth/Throat: Uvula is midline, oropharynx is clear and moist and mucous membranes are normal. No trismus in the jaw. Dental caries present. No dental abscesses or uvula swelling.  ttp of the lower central incisors with erythema and edema of the surrounding gingiva.   No facial swelling, obvious dental abscess, trismus, or sublingual abnml.    Neck: Normal range of motion. Neck supple.  Cardiovascular: Normal rate, regular rhythm and normal heart sounds.  No murmur heard. Pulmonary/Chest: Effort normal and breath sounds normal.  Musculoskeletal: Normal range of motion.  Lymphadenopathy:    She  has no cervical adenopathy.  Neurological: She is alert and oriented to person, place, and time. She exhibits normal muscle tone. Coordination normal.  Skin: Skin is warm and dry.  Nursing note and vitals reviewed.    ED Treatments / Results  Labs (all labs ordered are listed, but only abnormal results are displayed) Labs Reviewed - No data to display  EKG  EKG Interpretation None       Radiology No results found.  Procedures Procedures (including critical care time)  Medications Ordered in ED Medications  clindamycin (CLEOCIN) capsule 300 mg (not administered)  ibuprofen (ADVIL,MOTRIN) 100 MG/5ML suspension 400 mg (not administered)     Initial Impression / Assessment and Plan / ED Course  I have reviewed the triage vital signs and the nursing notes.  Pertinent labs & imaging results that were available during my care of the patient were reviewed by me and considered in my medical decision making (see chart for details).     Airway patent no concerning symptoms for dental abscess or Ludwig's angina.  Patient stable for discharge, agrees to close follow-up with dentistry.  Final Clinical Impressions(s) / ED Diagnoses   Final diagnoses:  Pain, dental  Gingivitis    ED Discharge Orders    None       Pauline Ausriplett, Ravynn Hogate, PA-C 06/08/17 1316    Loren RacerYelverton, David, MD 06/08/17 1415
